# Patient Record
Sex: Male | Born: 1990 | Race: Black or African American | Hispanic: No | Marital: Single | Smoking: Never smoker
Health system: Southern US, Community
[De-identification: ages and names within clinical notes are randomized; demographics above are authoritative.]

## PROBLEM LIST (undated history)

## (undated) DIAGNOSIS — H9192 Unspecified hearing loss, left ear: Secondary | ICD-10-CM

---
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
## 2011-05-28 ENCOUNTER — Emergency Department: Payer: Self-pay | Admitting: Emergency Medicine

## 2011-09-07 ENCOUNTER — Ambulatory Visit: Payer: Self-pay | Admitting: Physician Assistant

## 2014-04-30 ENCOUNTER — Ambulatory Visit: Payer: Self-pay | Admitting: Internal Medicine

## 2015-10-30 IMAGING — US US BREAST*L* LIMITED INC AXILLA
1 series · 4 of 4 positions shown · non-contrast
Comparison: None.

CLINICAL DATA: [age] year old male referred for bilateral
bloody nipple discharge. The patient reports that after being at the
gym a week ago he noticed blood on his nipples bilaterally which he
thinks originated from small bumps on the areola although his mother
was concerned that the blood may have been coming from his nipple.

EXAM:
ULTRASOUND OF THE BILATERAL BREAST

[Series 1: us breast*left* limited inc axilla · 0.08mm/px · 4 of 4 slices shown]
[im 1/4]
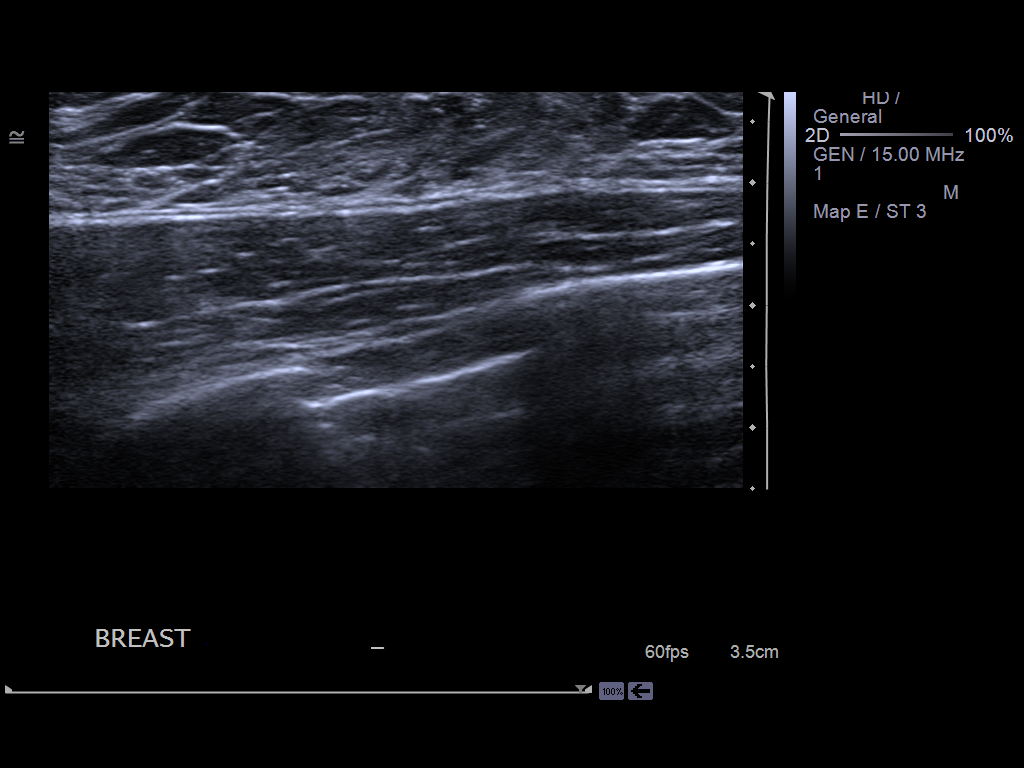
[im 2/4]
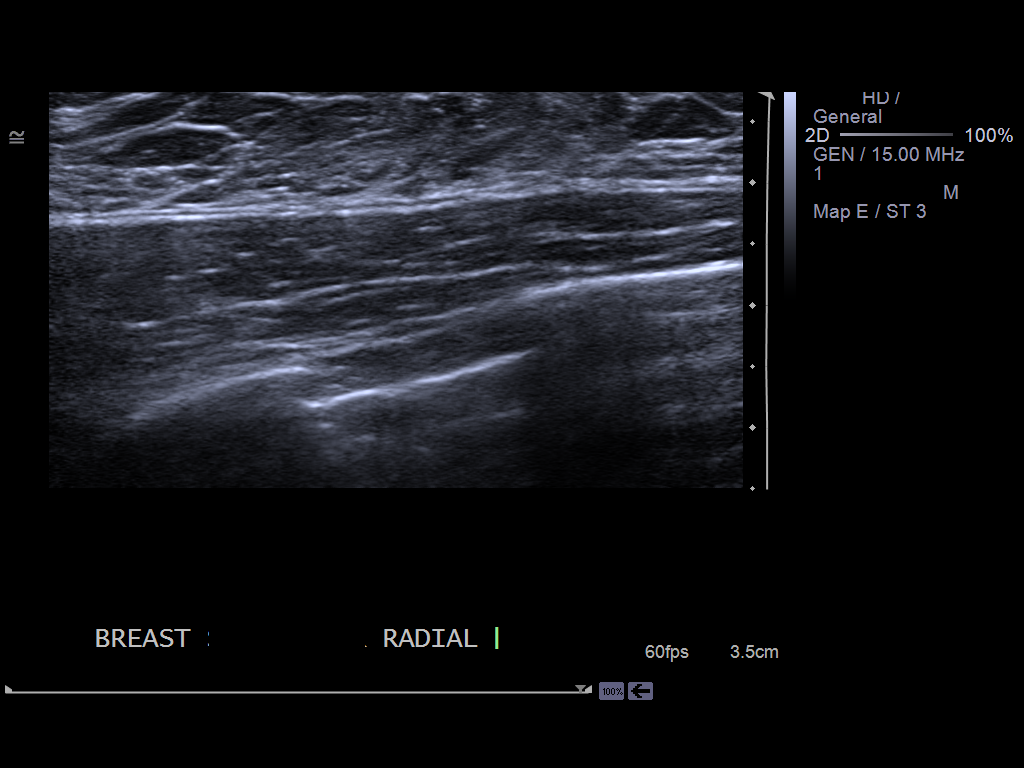
[im 3/4]
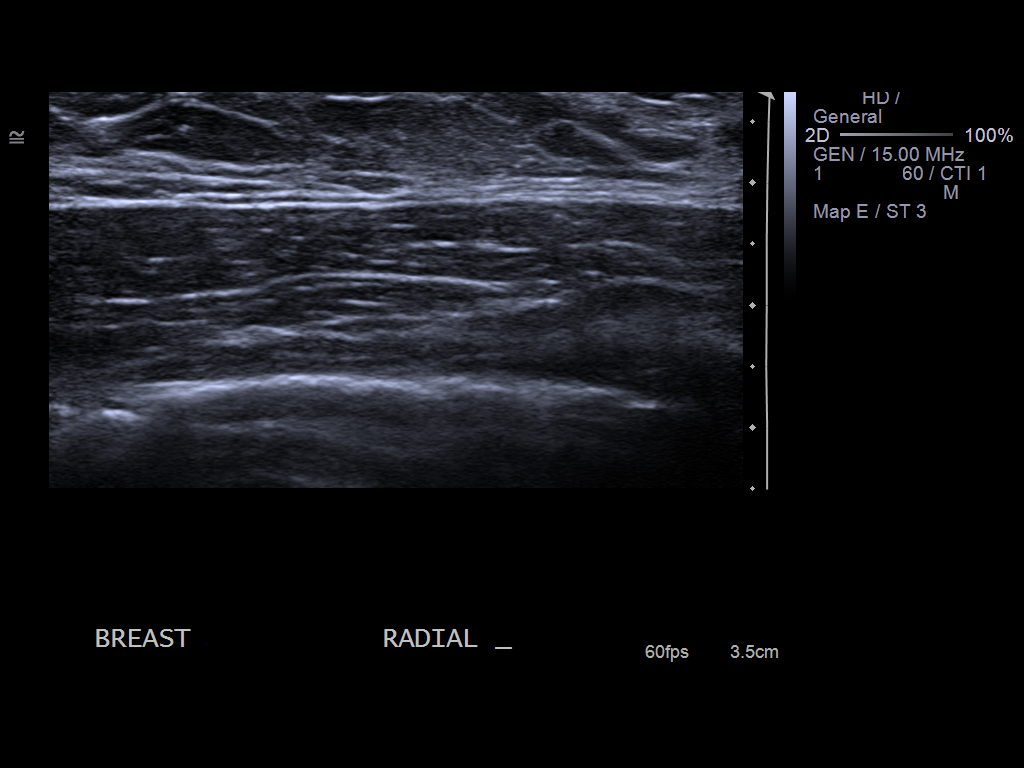
[im 4/4]
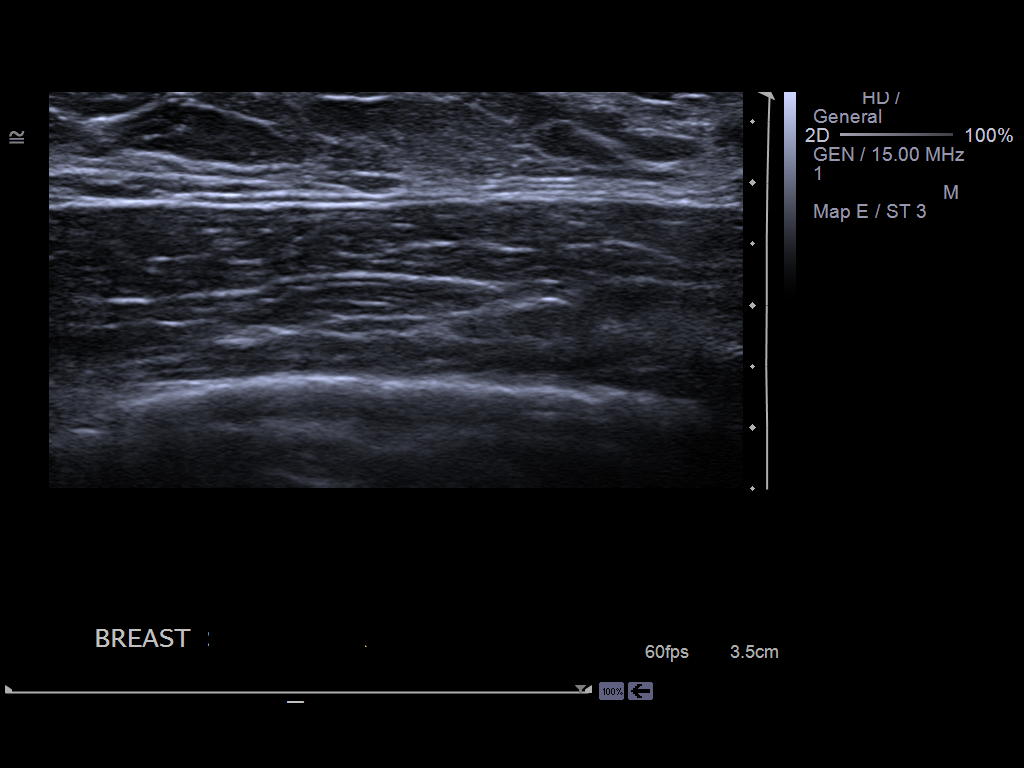

[4 of 4 positions shown; findings below may reference images not displayed]

FINDINGS: On physical exam, there are multiple raw hair follicles noted
bilaterally which likely are the source of the blood the patient
observed. No discharge was able to be expressed on exam of either
nipple.

Targeted ultrasound of the bilateral retroareolar region
demonstrates no suspicious cystic or solid sonographic finding
within either breast.
IMPRESSION: No sonographic evidence of malignancy. The blood that the patient
noticed in the region of his nipples is felt to have most likely
originated from raw hair follicles on the areola bilaterally.

RECOMMENDATION:
The patient was instructed to follow-up with his ordering clinician.
If the bloody nipple discharge recurs and is thought to originate
from the nipple, the patient was instructed to return for possible
further evaluation with mammography.

I have discussed the findings and recommendations with the patient.
Results were also provided in writing at the conclusion of the
visit. If applicable, a reminder letter will be sent to the patient
regarding the next appointment.

BI-RADS CATEGORY  2: Benign Finding(s)

## 2015-10-30 IMAGING — US US BREAST*R* LIMITED INC AXILLA
1 series · 3 of 3 positions shown · non-contrast
Comparison: None.

CLINICAL DATA: [age] year old male referred for bilateral
bloody nipple discharge. The patient reports that after being at the
gym a week ago he noticed blood on his nipples bilaterally which he
thinks originated from small bumps on the areola although his mother
was concerned that the blood may have been coming from his nipple.

EXAM:
ULTRASOUND OF THE BILATERAL BREAST

[Series 1: us breast*right* limited inc axilla · 0.08mm/px · 3 of 3 slices shown]
[im 1/3]
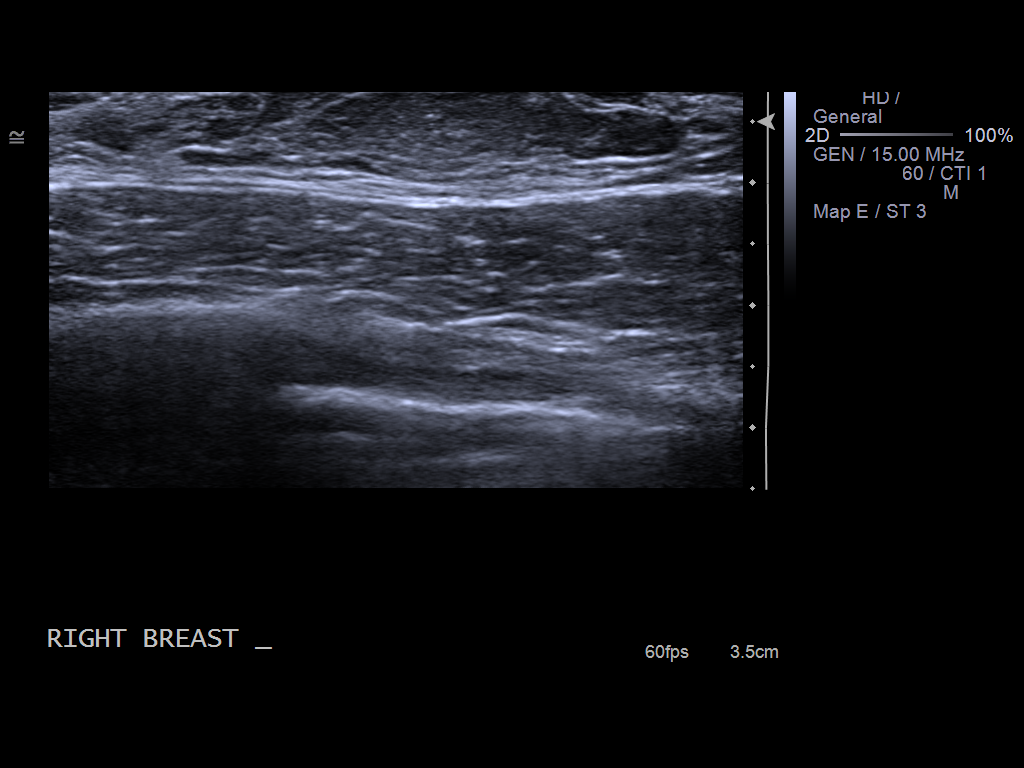
[im 2/3]
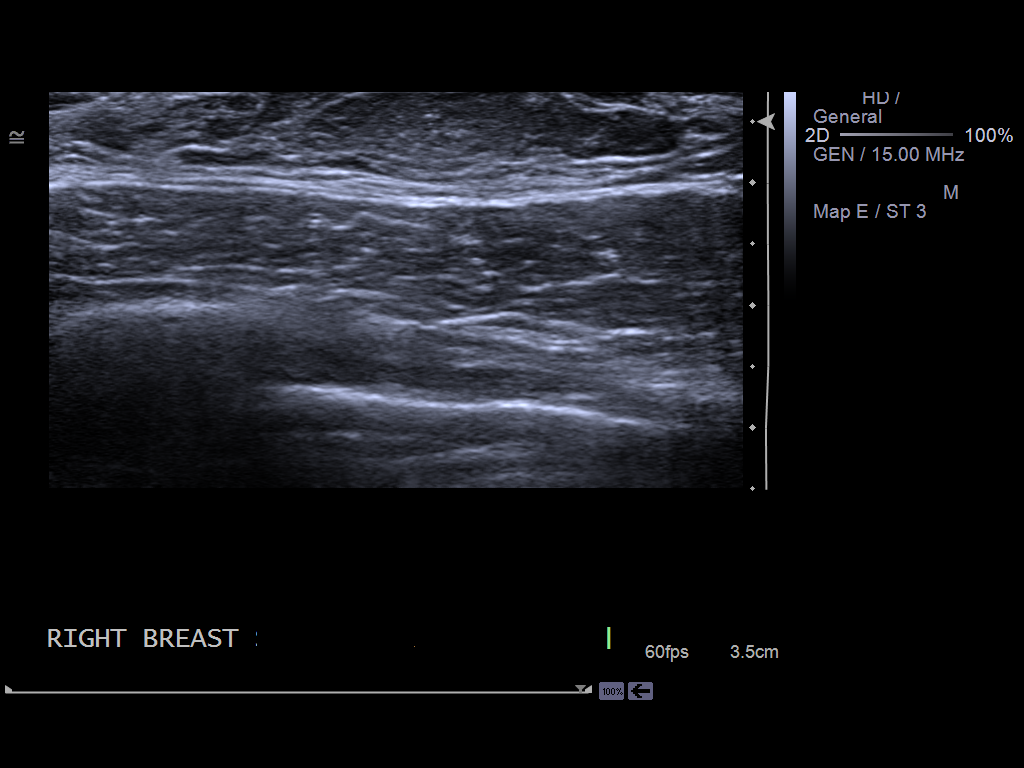
[im 3/3]
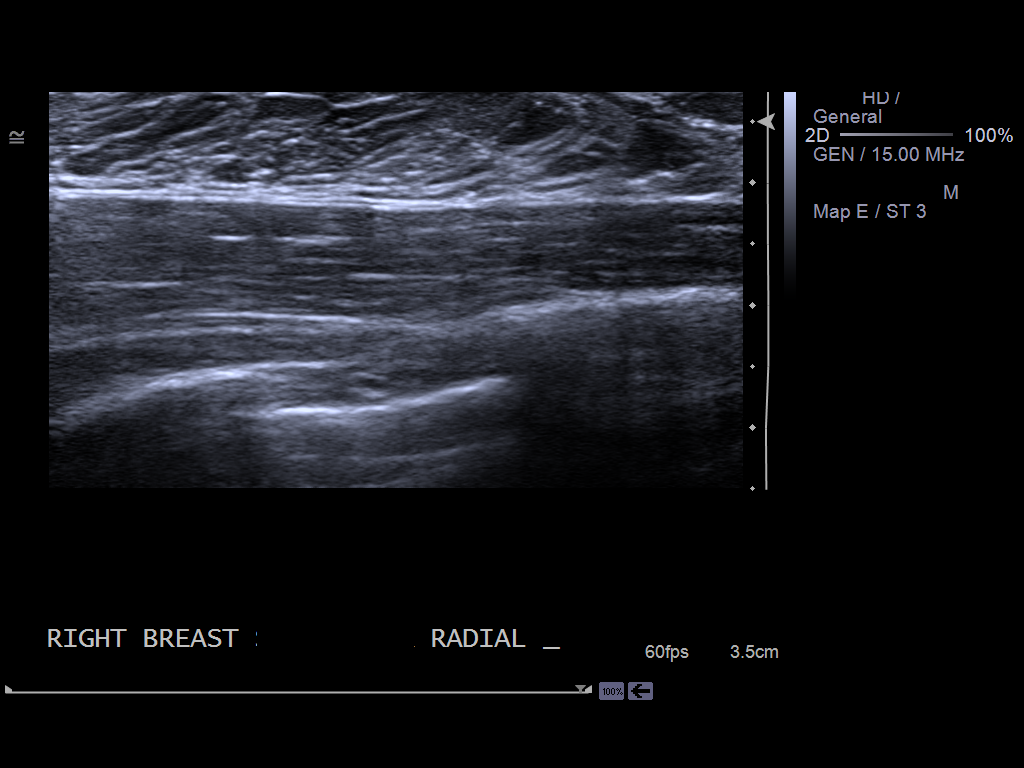

[3 of 3 positions shown; findings below may reference images not displayed]

FINDINGS: On physical exam, there are multiple raw hair follicles noted
bilaterally which likely are the source of the blood the patient
observed. No discharge was able to be expressed on exam of either
nipple.

Targeted ultrasound of the bilateral retroareolar region
demonstrates no suspicious cystic or solid sonographic finding
within either breast.
IMPRESSION: No sonographic evidence of malignancy. The blood that the patient
noticed in the region of his nipples is felt to have most likely
originated from raw hair follicles on the areola bilaterally.

RECOMMENDATION:
The patient was instructed to follow-up with his ordering clinician.
If the bloody nipple discharge recurs and is thought to originate
from the nipple, the patient was instructed to return for possible
further evaluation with mammography.

I have discussed the findings and recommendations with the patient.
Results were also provided in writing at the conclusion of the
visit. If applicable, a reminder letter will be sent to the patient
regarding the next appointment.

BI-RADS CATEGORY  2: Benign Finding(s)

## 2020-09-02 ENCOUNTER — Ambulatory Visit
Admission: EM | Admit: 2020-09-02 | Discharge: 2020-09-02 | Disposition: A | Discharge status: Home / Self Care | Payer: Federal, State, Local not specified - PPO

## 2020-09-02 ENCOUNTER — Other Ambulatory Visit: Payer: Self-pay

## 2020-09-02 ENCOUNTER — Encounter: Payer: Self-pay | Admitting: Emergency Medicine

## 2020-09-02 DIAGNOSIS — H66002 Acute suppurative otitis media without spontaneous rupture of ear drum, left ear: Secondary | ICD-10-CM

## 2020-09-02 HISTORY — DX: Unspecified hearing loss, left ear: H91.92

## 2020-09-02 MED ORDER — CIPRO HC 0.2-1 % OT SUSP: 3.0000 [drp] | Freq: Two times a day (BID) | OTIC | 0 refills | Status: AC

## 2020-09-02 MED ORDER — AMOXICILLIN-POT CLAVULANATE 875-125 MG PO TABS: 1.0000 | ORAL_TABLET | Freq: Two times a day (BID) | ORAL | 0 refills | Status: AC

## 2020-09-02 NOTE — ED Triage Notes (Signed)
Patient c/o left ear pain that started last night. He states this morning he woke up with some hearing loss.

## 2020-09-02 NOTE — Discharge Instructions (Signed)
Keep appointment with ear nose and throat Dr next week.

## 2022-08-01 ENCOUNTER — Other Ambulatory Visit: Payer: Self-pay | Admitting: Orthopedic Surgery

## 2022-08-01 DIAGNOSIS — S83272A Complex tear of lateral meniscus, current injury, left knee, initial encounter: Secondary | ICD-10-CM

## 2022-08-08 ENCOUNTER — Ambulatory Visit: Payer: Self-pay | Admitting: Licensed Clinical Social Worker

## 2022-08-17 ENCOUNTER — Ambulatory Visit
Admission: RE | Admit: 2022-08-17 | Discharge: 2022-08-17 | Disposition: A | Discharge status: Home / Self Care | Payer: Federal, State, Local not specified - PPO | Source: Ambulatory Visit | Attending: Orthopedic Surgery | Admitting: Orthopedic Surgery

## 2022-08-17 DIAGNOSIS — S83272A Complex tear of lateral meniscus, current injury, left knee, initial encounter: Secondary | ICD-10-CM
